# Patient Record
Sex: Male | Born: 1985 | Race: Black or African American | Hispanic: No | Marital: Single | Smoking: Current some day smoker
Health system: Southern US, Community
[De-identification: ages and names within clinical notes are randomized; demographics above are authoritative.]

## PROBLEM LIST (undated history)

## (undated) HISTORY — PX: ANKLE FRACTURE SURGERY: SHX122

---

## 2008-05-01 ENCOUNTER — Ambulatory Visit: Payer: Self-pay | Admitting: Family Medicine

## 2016-08-31 ENCOUNTER — Emergency Department
Admission: EM | Admit: 2016-08-31 | Discharge: 2016-08-31 | Disposition: A | Discharge status: Home / Self Care | Payer: BLUE CROSS/BLUE SHIELD | Attending: Student in an Organized Health Care Education/Training Program | Admitting: Student in an Organized Health Care Education/Training Program

## 2016-08-31 ENCOUNTER — Encounter: Payer: Self-pay | Admitting: Emergency Medicine

## 2016-08-31 DIAGNOSIS — R05 Cough: Secondary | ICD-10-CM | POA: Diagnosis present

## 2016-08-31 DIAGNOSIS — F17200 Nicotine dependence, unspecified, uncomplicated: Secondary | ICD-10-CM | POA: Diagnosis not present

## 2016-08-31 DIAGNOSIS — J208 Acute bronchitis due to other specified organisms: Secondary | ICD-10-CM

## 2016-08-31 MED ORDER — PSEUDOEPH-BROMPHEN-DM 30-2-10 MG/5ML PO SYRP: 5.0000 mL | ORAL_SOLUTION | Freq: Four times a day (QID) | ORAL | 0 refills | Status: AC | PRN

## 2016-08-31 MED ORDER — AZITHROMYCIN 250 MG PO TABS: ORAL_TABLET | ORAL | 0 refills | Status: AC

## 2016-08-31 NOTE — ED Notes (Signed)
See triage note  States he developed cough and nasal congestion since Sunday  Cough became worse yesterday  Low grade fever

## 2016-08-31 NOTE — ED Provider Notes (Signed)
[facility] Emergency Department Provider Note   ____________________________________________   First MD Initiated Contact with Patient 08/31/16 1226     (approximate)  I have reviewed the triage vital signs and the nursing notes.   HISTORY  Chief Complaint Cough    HPI William Landry is a 31 y.o. male patient complaining of productive cough and nasal congestion for 3 days. Patient states sputum is greenish in color. Patient denies any fever this complaint. Patient stated cough again worse yesterday. Patient rates his pain as a 6/10 states the pains only aching when he coughed. No palliative measures for this complaint.   History reviewed. No pertinent past medical history.  There are no active problems to display for this patient.   History reviewed. No pertinent surgical history.  Prior to Admission medications   Not on File    Allergies Patient has no known allergies.  No family history on file.  Social History Social History  Substance Use Topics  . Smoking status: Current Some Day Smoker  . Smokeless tobacco: Never Used  . Alcohol use Yes    Review of Systems Constitutional: No fever/chills Eyes: No visual changes. ENT: No sore throat.Nasal congestion Cardiovascular: Denies chest pain. Respiratory: Denies shortness of breath.Productive cough Gastrointestinal: No abdominal pain.  No nausea, no vomiting.  No diarrhea.  No constipation. Genitourinary: Negative for dysuria. Musculoskeletal: Negative for back pain. Skin: Negative for rash. Neurological: Negative for headaches, focal weakness or numbness.    ____________________________________________   PHYSICAL EXAM:  VITAL SIGNS: ED Triage Vitals  Enc Vitals Group     BP 08/31/16 1214 (!) 121/54     Pulse Rate 08/31/16 1214 66     Resp 08/31/16 1214 20     Temp 08/31/16 1214 98.9 F (37.2 C)     Temp Source 08/31/16 1214 Oral     SpO2 08/31/16 1214 97 %   Weight 08/31/16 1214 178 lb (80.7 kg)     Height 08/31/16 1214 5\' 11"  (1.803 m)     Head Circumference --      Peak Flow --      Pain Score 08/31/16 1215 6     Pain Loc --      Pain Edu? --      Excl. in GC? --     Constitutional: Alert and oriented. Well appearing and in no acute distress. Eyes: Conjunctivae are normal. PERRL. EOMI. Head: Atraumatic. Nose: Edematous nasal turbinates clear rhinorrhea . Mouth/Throat: Mucous membranes are moist.  Oropharynx non-erythematous. Neck: No stridor.  No cervical spine tenderness to palpation. Hematological/Lymphatic/Immunilogical: No cervical lymphadenopathy. Cardiovascular: Normal rate, regular rhythm. Grossly normal heart sounds.  Good peripheral circulation. Respiratory: Normal respiratory effort.  No retractions. Bilateral Rales Gastrointestinal: Soft and nontender. No distention. No abdominal bruits. No CVA tenderness. Musculoskeletal: No lower extremity tenderness nor edema.  No joint effusions. Neurologic:  Normal speech and language. No gross focal neurologic deficits are appreciated. No gait instability. Skin:  Skin is warm, dry and intact. No rash noted. Psychiatric: Mood and affect are normal. Speech and behavior are normal.  ____________________________________________   LABS (all labs ordered are listed, but only abnormal results are displayed)  Labs Reviewed - No data to display ____________________________________________  EKG   ____________________________________________  RADIOLOGY   ____________________________________________   PROCEDURES  Procedure(s) performed: None  Procedures  Critical Care performed: No  ____________________________________________   INITIAL IMPRESSION / ASSESSMENT AND PLAN / ED COURSE  Pertinent labs & imaging results that  were available during my care of the patient were reviewed by me and considered in my medical decision making (see chart for details).  Bronchitis.  Patient given discharge Instructions. Patient given prescription for Zithromax and Bromfed-DM. Patient given a work note. Patient advised follow-up with family doctor condition persists.      ____________________________________________   FINAL CLINICAL IMPRESSION(S) / ED DIAGNOSES  Final diagnoses:  None      NEW MEDICATIONS STARTED DURING THIS VISIT:  New Prescriptions   No medications on file     Note:  This document was prepared using Dragon voice recognition software and may include unintentional dictation errors.    Joni Reining, PA-C 08/31/16 1250    Willy Eddy, MD 08/31/16 [phone]

## 2016-08-31 NOTE — ED Notes (Signed)
Pt verbalizes understanding of discharge instructions, medications and importance of follow up.

## 2016-08-31 NOTE — ED Triage Notes (Signed)
Pt  With cough and congestion for a couple of days. States sputum is green.

## 2017-07-19 ENCOUNTER — Emergency Department
Admission: EM | Admit: 2017-07-19 | Discharge: 2017-07-19 | Disposition: A | Discharge status: Home / Self Care | Payer: BLUE CROSS/BLUE SHIELD | Attending: Student in an Organized Health Care Education/Training Program | Admitting: Student in an Organized Health Care Education/Training Program

## 2017-07-19 ENCOUNTER — Encounter: Payer: Self-pay | Admitting: Emergency Medicine

## 2017-07-19 ENCOUNTER — Other Ambulatory Visit: Payer: Self-pay

## 2017-07-19 DIAGNOSIS — F17210 Nicotine dependence, cigarettes, uncomplicated: Secondary | ICD-10-CM | POA: Insufficient documentation

## 2017-07-19 DIAGNOSIS — H1031 Unspecified acute conjunctivitis, right eye: Secondary | ICD-10-CM | POA: Insufficient documentation

## 2017-07-19 MED ORDER — TOBRAMYCIN 0.3 % OP SOLN: 2.0000 [drp] | OPHTHALMIC | 0 refills | Status: AC

## 2017-07-19 NOTE — ED Triage Notes (Signed)
States he developed right eye irritation and drainage about 3 days ago

## 2017-07-19 NOTE — ED Provider Notes (Signed)
[facility] Emergency Department Provider Note  ____________________________________________   First MD Initiated Contact with Patient 07/19/17 1324     (approximate)  I have reviewed the triage vital signs and the nursing notes.   HISTORY  Chief Complaint Eye Drainage    HPI Paticia StackMichael L Febo is a 32 y.o. male complains of right eye redness and drainage, states it is matted up every morning, he denies fever chills, denies any visual changes, he denies any injury, patient states he has had the symptoms for about 3 days   History reviewed. No pertinent past medical history.  There are no active problems to display for this patient.   History reviewed. No pertinent surgical history.  Prior to Admission medications   Medication Sig Start Date End Date Taking? Authorizing Provider  brompheniramine-pseudoephedrine-DM 30-2-10 MG/5ML syrup Take 5 mLs by mouth 4 (four) times daily as needed. 08/31/16   Joni ReiningSmith, Ronald K, PA-C  tobramycin (TOBREX) 0.3 % ophthalmic solution Place 2 drops into both eyes every 4 (four) hours. 07/19/17   Faythe GheeFisher, Susan W, PA-C    Allergies Patient has no known allergies.  No family history on file.  Social History Social History   Tobacco Use  . Smoking status: Current Some Day Smoker  . Smokeless tobacco: Never Used  Substance Use Topics  . Alcohol use: Yes  . Drug use: Not on file    Review of Systems  Constitutional: No fever/chills Eyes: No visual changes.  Positive for eye redness ENT: No sore throat. Respiratory: Denies cough Genitourinary: Negative for dysuria. Musculoskeletal: Negative for back pain. Skin: Negative for rash.    ____________________________________________   PHYSICAL EXAM:  VITAL SIGNS: ED Triage Vitals  Enc Vitals Group     BP 07/19/17 1321 132/60     Pulse Rate 07/19/17 1321 77     Resp 07/19/17 1321 20     Temp 07/19/17 1321 98.3 F (36.8 C)     Temp Source 07/19/17 1321  Oral     SpO2 07/19/17 1321 95 %     Weight 07/19/17 1321 186 lb (84.4 kg)     Height 07/19/17 1321 6' (1.829 m)     Head Circumference --      Peak Flow --      Pain Score 07/19/17 1323 7     Pain Loc --      Pain Edu? --      Excl. in GC? --     Constitutional: Alert and oriented. Well appearing and in no acute distress. Eyes: Conjunctivae of the right eye is red and swollen, some crusting on the lashes, no foreign bodies noted.  Head: Atraumatic. Nose: No congestion/rhinnorhea. Mouth/Throat: Mucous membranes are moist.   Cardiovascular: Normal rate, regular rhythm.  Heart sounds are normal Respiratory: Normal respiratory effort.  No retractions, lungs clear to auscultation GU: deferred Musculoskeletal: FROM all extremities, warm and well perfused Neurologic:  Normal speech and language.  Skin:  Skin is warm, dry and intact. No rash noted. Psychiatric: Mood and affect are normal. Speech and behavior are normal.  ____________________________________________   LABS (all labs ordered are listed, but only abnormal results are displayed)  Labs Reviewed - No data to display ____________________________________________   ____________________________________________  RADIOLOGY    ____________________________________________   PROCEDURES  Procedure(s) performed: No  Procedures    ____________________________________________   INITIAL IMPRESSION / ASSESSMENT AND PLAN / ED COURSE  Pertinent labs & imaging results that were available during my care of  the patient were reviewed by me and considered in my medical decision making (see chart for details).  Patient is a 32 year old male complaining of right eye redness and matting for 3 days, and physical exam of the eye is red, some crusting on the lashes, remainder of the exam is benign, diagnosis is acute conjunctivitis, patient was given prescription for tobramycin drops, explained how to use them, instructed him to  wash his hands frequently especially after he touches his eye, patient states he understands, he does understand he is contagious for the next 24 hours, patient states he will comply with all of her recommendations, he was discharged in stable condition     As part of my medical decision making, I reviewed the following data within the electronic MEDICAL RECORD NUMBER  ____________________________________________   FINAL CLINICAL IMPRESSION(S) / ED DIAGNOSES  Final diagnoses:  Acute bacterial conjunctivitis of right eye      NEW MEDICATIONS STARTED DURING THIS VISIT:  This SmartLink is deprecated. Use AVSMEDLIST instead to display the medication list for a patient.   Note:  This document was prepared using Dragon voice recognition software and may include unintentional dictation errors.    Faythe Ghee, PA-C 07/19/17 1334    Willy Eddy, MD 07/19/17 1357

## 2017-07-19 NOTE — Discharge Instructions (Signed)
Follow-up with your regular doctor if you are not better in 2-3 days, if the conjunctivitis is worsening or you are having difficulty with vision please follow-up with ophthalmology, phone number was provided for you, use the tobramycin drops as instructed, wash your hands after touching your eye, you are contagious for at least 24 hours, return to emergency department if you are worsening

## 2017-11-25 ENCOUNTER — Emergency Department
Admission: EM | Admit: 2017-11-25 | Discharge: 2017-11-25 | Disposition: A | Discharge status: Home / Self Care | Payer: No Typology Code available for payment source | Attending: Emergency Medicine | Admitting: Emergency Medicine

## 2017-11-25 ENCOUNTER — Encounter: Payer: Self-pay | Admitting: *Deleted

## 2017-11-25 ENCOUNTER — Other Ambulatory Visit: Payer: Self-pay

## 2017-11-25 ENCOUNTER — Emergency Department: Payer: No Typology Code available for payment source

## 2017-11-25 DIAGNOSIS — M25512 Pain in left shoulder: Secondary | ICD-10-CM | POA: Insufficient documentation

## 2017-11-25 DIAGNOSIS — F17210 Nicotine dependence, cigarettes, uncomplicated: Secondary | ICD-10-CM | POA: Insufficient documentation

## 2017-11-25 MED ORDER — MELOXICAM 15 MG PO TABS: 15.0000 mg | ORAL_TABLET | Freq: Every day | ORAL | 0 refills | Status: AC

## 2017-11-25 MED ORDER — CYCLOBENZAPRINE HCL 5 MG PO TABS: ORAL_TABLET | ORAL | 0 refills | Status: AC
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
## 2017-11-29 ENCOUNTER — Encounter: Payer: Self-pay | Admitting: Emergency Medicine

## 2017-11-29 ENCOUNTER — Emergency Department
Admission: EM | Admit: 2017-11-29 | Discharge: 2017-11-29 | Disposition: A | Discharge status: Home / Self Care | Payer: No Typology Code available for payment source | Attending: Emergency Medicine | Admitting: Emergency Medicine

## 2017-11-29 ENCOUNTER — Other Ambulatory Visit: Payer: Self-pay

## 2017-11-29 DIAGNOSIS — Y999 Unspecified external cause status: Secondary | ICD-10-CM | POA: Insufficient documentation

## 2017-11-29 DIAGNOSIS — Y939 Activity, unspecified: Secondary | ICD-10-CM | POA: Insufficient documentation

## 2017-11-29 DIAGNOSIS — F17210 Nicotine dependence, cigarettes, uncomplicated: Secondary | ICD-10-CM | POA: Diagnosis not present

## 2017-11-29 DIAGNOSIS — M546 Pain in thoracic spine: Secondary | ICD-10-CM | POA: Diagnosis not present

## 2017-11-29 DIAGNOSIS — Y92410 Unspecified street and highway as the place of occurrence of the external cause: Secondary | ICD-10-CM | POA: Insufficient documentation

## 2017-11-29 DIAGNOSIS — M542 Cervicalgia: Secondary | ICD-10-CM | POA: Diagnosis not present
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
## 2019-03-19 ENCOUNTER — Other Ambulatory Visit: Payer: Self-pay

## 2019-03-19 DIAGNOSIS — Z20822 Contact with and (suspected) exposure to covid-19: Secondary | ICD-10-CM

## 2019-03-20 LAB — NOVEL CORONAVIRUS, NAA: SARS-CoV-2, NAA: NOT DETECTED
